# Patient Record
Sex: Male | Born: 1980 | Hispanic: No | Marital: Single | Smoking: Current every day smoker
Health system: Southern US, Community
[De-identification: ages and names within clinical notes are randomized; demographics above are authoritative.]

---

## 2011-06-16 ENCOUNTER — Emergency Department (HOSPITAL_BASED_OUTPATIENT_CLINIC_OR_DEPARTMENT_OTHER)
Admission: EM | Admit: 2011-06-16 | Discharge: 2011-06-17 | Disposition: A | Discharge status: Home / Self Care | Payer: Managed Care, Other (non HMO) | Attending: Emergency Medicine | Admitting: Emergency Medicine

## 2011-06-16 ENCOUNTER — Encounter (HOSPITAL_BASED_OUTPATIENT_CLINIC_OR_DEPARTMENT_OTHER): Payer: Self-pay

## 2011-06-16 DIAGNOSIS — F17200 Nicotine dependence, unspecified, uncomplicated: Secondary | ICD-10-CM | POA: Insufficient documentation

## 2011-06-16 DIAGNOSIS — K529 Noninfective gastroenteritis and colitis, unspecified: Secondary | ICD-10-CM

## 2011-06-16 DIAGNOSIS — K5289 Other specified noninfective gastroenteritis and colitis: Secondary | ICD-10-CM | POA: Insufficient documentation

## 2011-06-16 DIAGNOSIS — R112 Nausea with vomiting, unspecified: Secondary | ICD-10-CM | POA: Insufficient documentation

## 2011-06-16 DIAGNOSIS — R197 Diarrhea, unspecified: Secondary | ICD-10-CM | POA: Insufficient documentation

## 2011-06-16 LAB — COMPREHENSIVE METABOLIC PANEL
Albumin: 4.2 g/dL (ref 3.5–5.2)
BUN: 15 mg/dL (ref 6–23)
CO2: 30 mEq/L (ref 19–32)
Chloride: 101 mEq/L (ref 96–112)
Creatinine, Ser: 0.8 mg/dL (ref 0.50–1.35)
GFR calc Af Amer: >90 mL/min (ref >90)
GFR calc non Af Amer: >90 mL/min (ref >90)
Glucose, Bld: 101 mg/dL — ABNORMAL HIGH (ref 70–99)
Total Bilirubin: 1.1 mg/dL (ref 0.3–1.2)

## 2011-06-16 LAB — CBC
HCT: 42.2 % (ref 39.0–52.0)
Hemoglobin: 14.9 g/dL (ref 13.0–17.0)
MCHC: 35.3 g/dL (ref 30.0–36.0)

## 2011-06-16 LAB — DIFFERENTIAL
Basophils Relative: 0 % (ref 0–1)
Monocytes Absolute: 0.4 10*3/uL (ref 0.1–1.0)
Monocytes Relative: 4 % (ref 3–12)
Neutro Abs: 9.1 10*3/uL — ABNORMAL HIGH (ref 1.7–7.7)

## 2011-06-16 MED ORDER — SODIUM CHLORIDE 0.9 % IV SOLN
Freq: Once | INTRAVENOUS | Status: AC
  Administered 2011-06-16: 23:00:00 via INTRAVENOUS

## 2011-06-16 MED ORDER — KETOROLAC TROMETHAMINE 30 MG/ML IJ SOLN
30.0000 mg | Freq: Once | INTRAMUSCULAR | Status: AC
  Administered 2011-06-16: 30 mg via INTRAVENOUS
  Filled 2011-06-16: qty 1

## 2011-06-16 MED ORDER — ONDANSETRON HCL 4 MG/2ML IJ SOLN
4.0000 mg | Freq: Once | INTRAMUSCULAR | Status: AC
  Administered 2011-06-16: 4 mg via INTRAVENOUS
  Filled 2011-06-16: qty 2

## 2011-06-16 NOTE — ED Provider Notes (Signed)
History     CSN: [id number]  Arrival date & time 06/16/11  2213   First MD Initiated Contact with Patient 06/16/11 2306      Chief Complaint  Patient presents with  . Nausea, vomiting and diarrhea     (Consider location/radiation/quality/duration/timing/severity/associated sxs/prior treatment) Patient is a 31 y.o. male presenting with diarrhea. The history is provided by the patient.  Diarrhea The primary symptoms include nausea, vomiting and diarrhea. The illness began today. The onset was sudden. The problem has been rapidly worsening.  The illness is also significant for chills. Associated medical issues do not include inflammatory bowel disease.    History reviewed. No pertinent past medical history.  History reviewed. No pertinent past surgical history.  No family history on file.  History  Substance Use Topics  . Smoking status: Current Everyday Smoker  . Smokeless tobacco: Not on file  . Alcohol Use: Yes      Review of Systems  Constitutional: Positive for chills.  Gastrointestinal: Positive for nausea, vomiting and diarrhea.  All other systems reviewed and are negative.    Allergies  Review of patient's allergies indicates no known allergies.  Home Medications   Current Outpatient Rx  Name Route Sig Dispense Refill  . CALCIUM CARBONATE ANTACID 500 MG PO CHEW Oral Chew 1 tablet by mouth daily.      BP 126/87  Pulse 95  Temp(Src) 98.7 F (37.1 C) (Oral)  Resp 18  Ht 6' (1.829 m)  Wt 160 lb (72.576 kg)  BMI 21.70 kg/m2  SpO2 99%  Physical Exam  Nursing note and vitals reviewed. Constitutional: He is oriented to person, place, and time. He appears well-developed and well-nourished. No distress.  HENT:  Head: Normocephalic and atraumatic.  Mouth/Throat: Oropharynx is clear and moist.  Neck: Normal range of motion. Neck supple.  Cardiovascular: Normal rate and regular rhythm.   No murmur heard. Pulmonary/Chest: Breath sounds normal. No  respiratory distress. He has no wheezes.  Abdominal: Soft. Bowel sounds are normal. He exhibits no distension. There is no tenderness.  Musculoskeletal: Normal range of motion.  Neurological: He is alert and oriented to person, place, and time.  Skin: Skin is warm and dry. He is not diaphoretic.    ED Course  Procedures (including critical care time)   Labs Reviewed  CBC  DIFFERENTIAL  COMPREHENSIVE METABOLIC PANEL   No results found.   No diagnosis found.    MDM  The labs and presentation reflect acute gastroenteritis.  Will discharge with phenergan, follow up prn.        Geoffery Lyons, MD 06/17/11 0000

## 2011-06-16 NOTE — ED Notes (Signed)
C/o diarrhea x 5 started approx 1230pm-vomiting x 3 started 4pm-denies abd pain

## 2011-06-17 MED ORDER — PROMETHAZINE HCL 25 MG PO TABS: 25.0000 mg | ORAL_TABLET | Freq: Four times a day (QID) | ORAL | Status: AC | PRN

## 2011-06-17 NOTE — Discharge Instructions (Signed)
Viral Gastroenteritis Viral gastroenteritis is also known as stomach flu. This condition affects the stomach and intestinal tract. It can cause sudden diarrhea and vomiting. The illness typically lasts 3 to 8 days. Most people develop an immune response that eventually gets rid of the virus. While this natural response develops, the virus can make you quite ill. CAUSES  Many different viruses can cause gastroenteritis, such as rotavirus or noroviruses. You can catch one of these viruses by consuming contaminated food or water. You may also catch a virus by sharing utensils or other personal items with an infected person or by touching a contaminated surface. SYMPTOMS  The most common symptoms are diarrhea and vomiting. These problems can cause a severe loss of body fluids (dehydration) and a body salt (electrolyte) imbalance. Other symptoms may include:  Fever.   Headache.   Fatigue.   Abdominal pain.  DIAGNOSIS  Your caregiver can usually diagnose viral gastroenteritis based on your symptoms and a physical exam. A stool sample may also be taken to test for the presence of viruses or other infections. TREATMENT  This illness typically goes away on its own. Treatments are aimed at rehydration. The most serious cases of viral gastroenteritis involve vomiting so severely that you are not able to keep fluids down. In these cases, fluids must be given through an intravenous line (IV). HOME CARE INSTRUCTIONS   Drink enough fluids to keep your urine clear or pale yellow. Drink small amounts of fluids frequently and increase the amounts as tolerated.   Ask your caregiver for specific rehydration instructions.   Avoid:   Foods high in sugar.   Alcohol.   Carbonated drinks.   Tobacco.   Juice.   Caffeine drinks.   Extremely hot or cold fluids.   Fatty, greasy foods.   Too much intake of anything at one time.   Dairy products until 24 to 48 hours after diarrhea stops.   You may  consume probiotics. Probiotics are active cultures of beneficial bacteria. They may lessen the amount and number of diarrheal stools in adults. Probiotics can be found in yogurt with active cultures and in supplements.   Wash your hands well to avoid spreading the virus.   Only take over-the-counter or prescription medicines for pain, discomfort, or fever as directed by your caregiver. Do not give aspirin to children. Antidiarrheal medicines are not recommended.   Ask your caregiver if you should continue to take your regular prescribed and over-the-counter medicines.   Keep all follow-up appointments as directed by your caregiver.  SEEK IMMEDIATE MEDICAL CARE IF:   You are unable to keep fluids down.   You do not urinate at least once every 6 to 8 hours.   You develop shortness of breath.   You notice blood in your stool or vomit. This may look like coffee grounds.   You have abdominal pain that increases or is concentrated in one small area (localized).   You have persistent vomiting or diarrhea.   You have a fever.   The patient is a child younger than 3 months, and he or she has a fever.   The patient is a child older than 3 months, and he or she has a fever and persistent symptoms.   The patient is a child older than 3 months, and he or she has a fever and symptoms suddenly get worse.   The patient is a baby, and he or she has no tears when crying.  MAKE SURE YOU:     Understand these instructions.   Will watch your condition.   Will get help right away if you are not doing well or get worse.  Document Released: 01/26/2005 Document Revised: 01/15/2011 Document Reviewed: 11/12/2010 [employer] Patient Information 2012 [employer]. 

## 2011-11-07 ENCOUNTER — Emergency Department (HOSPITAL_BASED_OUTPATIENT_CLINIC_OR_DEPARTMENT_OTHER)
Admission: EM | Admit: 2011-11-07 | Discharge: 2011-11-07 | Disposition: A | Discharge status: Home / Self Care | Payer: Worker's Compensation | Attending: Emergency Medicine | Admitting: Emergency Medicine

## 2011-11-07 ENCOUNTER — Encounter (HOSPITAL_BASED_OUTPATIENT_CLINIC_OR_DEPARTMENT_OTHER): Payer: Self-pay | Admitting: Emergency Medicine

## 2011-11-07 ENCOUNTER — Emergency Department (HOSPITAL_BASED_OUTPATIENT_CLINIC_OR_DEPARTMENT_OTHER): Payer: Worker's Compensation

## 2011-11-07 DIAGNOSIS — R0789 Other chest pain: Secondary | ICD-10-CM

## 2011-11-07 DIAGNOSIS — F17200 Nicotine dependence, unspecified, uncomplicated: Secondary | ICD-10-CM | POA: Insufficient documentation

## 2011-11-07 DIAGNOSIS — R071 Chest pain on breathing: Secondary | ICD-10-CM | POA: Insufficient documentation

## 2011-11-07 MED ORDER — IBUPROFEN 200 MG PO TABS
600.0000 mg | ORAL_TABLET | Freq: Once | ORAL | Status: AC
  Administered 2011-11-07: 600 mg via ORAL
  Filled 2011-11-07: qty 1

## 2011-11-07 NOTE — ED Notes (Signed)
Discharge instructions reviewed. Pt verbalized understanding.  

## 2011-11-07 NOTE — ED Notes (Signed)
Pt states he was at working and he was lifting a box that weighed around 30-40lbs and when he turned to put box down he felt a pop in his left chest area. He states it "hurts to take a deep breath". Rates pain a 4/10 but increases when he grabs for anything.

## 2011-11-07 NOTE — ED Provider Notes (Addendum)
History     CSN: [id number]  Arrival date & time 11/07/11  1032   First MD Initiated Contact with Patient 11/07/11 1048      Chief Complaint  Patient presents with  . Muscle Pain    (Consider location/radiation/quality/duration/timing/severity/associated sxs/prior treatment) Patient is a 31 y.o. male presenting with musculoskeletal pain. The history is provided by the patient.  Muscle Pain This is a new (was lifting a box and felt a pop in the chest wall and severe pain and then moved his arm and felt another pop with impvoement in sx.) problem. The current episode started 1 to 2 hours ago. The problem occurs constantly. The problem has been gradually improving. Associated symptoms include chest pain. Exacerbated by: certain movement of the arms and deep breathing. The symptoms are relieved by rest. He has tried nothing for the symptoms. The treatment provided significant relief.    History reviewed. No pertinent past medical history.  History reviewed. No pertinent past surgical history.  History reviewed. No pertinent family history.  History  Substance Use Topics  . Smoking status: Current Every Day Smoker -- 0.5 packs/day  . Smokeless tobacco: Not on file  . Alcohol Use: Yes      Review of Systems  Cardiovascular: Positive for chest pain.  All other systems reviewed and are negative.    Allergies  Review of patient's allergies indicates no known allergies.  Home Medications   Current Outpatient Rx  Name Route Sig Dispense Refill  . CALCIUM CARBONATE ANTACID 500 MG PO CHEW Oral Chew 1 tablet by mouth daily.    Marland Kitchen PROMETHAZINE HCL 25 MG PO TABS Oral Take 1 tablet (25 mg total) by mouth every 6 (six) hours as needed for nausea. 10 tablet 0    BP 121/73  Pulse 66  Temp 98 F (36.7 C) (Oral)  Resp 20  SpO2 100%  Physical Exam  Nursing note and vitals reviewed. Constitutional: He is oriented to person, place, and time. He appears well-developed and  well-nourished. No distress.  HENT:  Head: Normocephalic and atraumatic.  Mouth/Throat: Oropharynx is clear and moist.  Eyes: Conjunctivae normal and EOM are normal. Pupils are equal, round, and reactive to light.  Neck: Normal range of motion. Neck supple.  Cardiovascular: Normal rate, regular rhythm and intact distal pulses.   No murmur heard. Pulmonary/Chest: Effort normal and breath sounds normal. No respiratory distress. He has no wheezes. He has no rales. He exhibits tenderness.    Abdominal: Soft. He exhibits no distension. There is no tenderness. There is no rebound and no guarding.  Musculoskeletal: Normal range of motion. He exhibits no edema and no tenderness.  Neurological: He is alert and oriented to person, place, and time.  Skin: Skin is warm and dry. No rash noted. No erythema.  Psychiatric: He has a normal mood and affect. His behavior is normal.    ED Course  Procedures (including critical care time)  Labs Reviewed - No data to display Dg Chest 2 View  11/07/2011  *RADIOLOGY REPORT*  Clinical Data: Left-sided chest wall pain.  CHEST - 2 VIEW  Comparison: No priors.  Findings: Lung volumes are normal.  No consolidative airspace disease.  No pleural effusions.  No pneumothorax.  No pulmonary nodule or mass noted.  Pulmonary vasculature and the cardiomediastinal silhouette are within normal limits.  IMPRESSION: 1. No radiographic evidence of acute cardiopulmonary disease.   Original Report Authenticated By: Florencia Reasons, M.D.      1. Chest wall  pain       MDM   Patient with chest wall pain on the left side after he was lifting a 30-40 pound crate today. He states that he moved his arm and he felt that there would pop back. Patient has no deficits now and only mild chest wall tenderness. He has no shortness of breath and normal breath sounds. Chest x-ray pending however feel most likely subluxation. Patient was given ibuprofen for his pain.   12:20 PM' Plain  film neg will d/c home.     Gwyneth Sprout, MD 11/07/11 1221  Gwyneth Sprout, MD 11/07/11 1229

## 2013-09-29 IMAGING — CR DG CHEST 2V
2 series · 2 of 2 positions shown · non-contrast
Comparison: No priors.

CLINICAL DATA: Left-sided chest wall pain.

CHEST - 2 VIEW

[w chest pa]
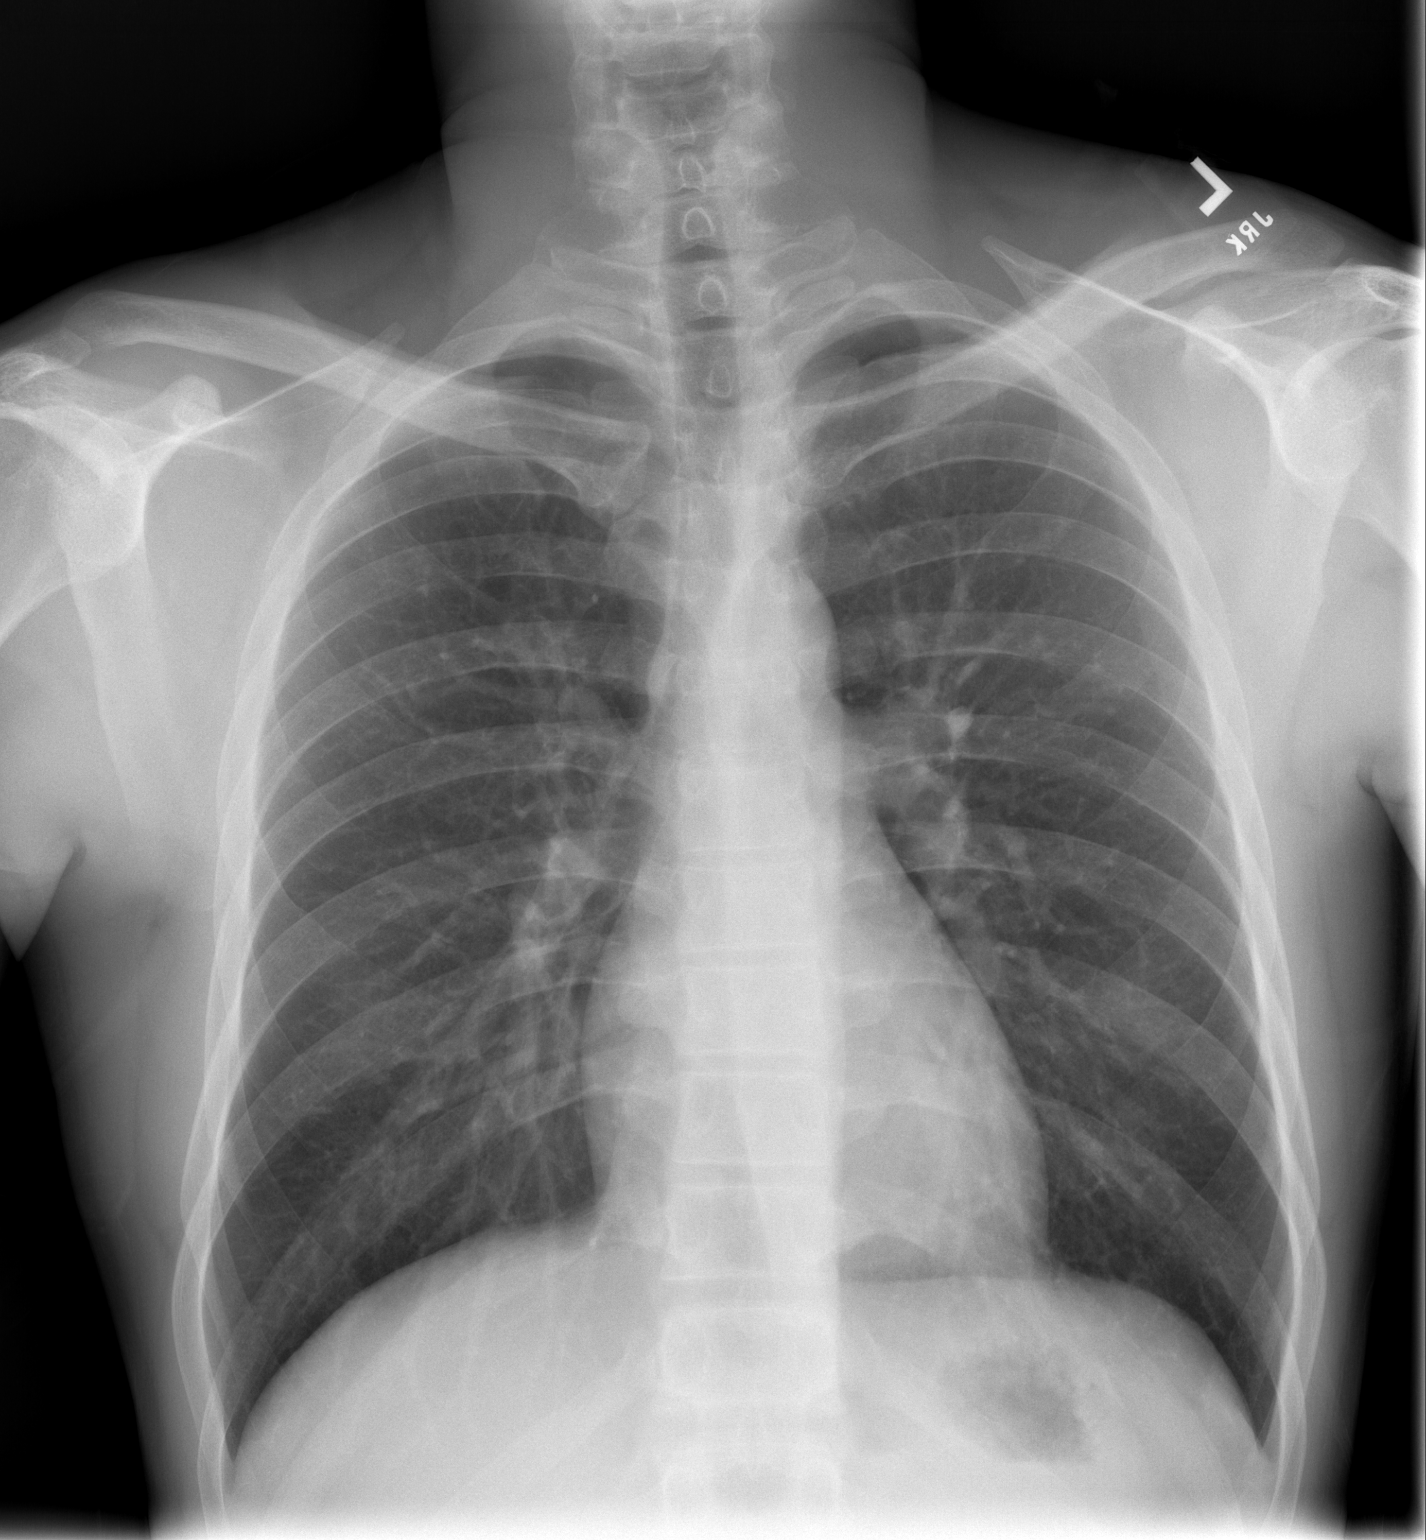

[w chest lat]
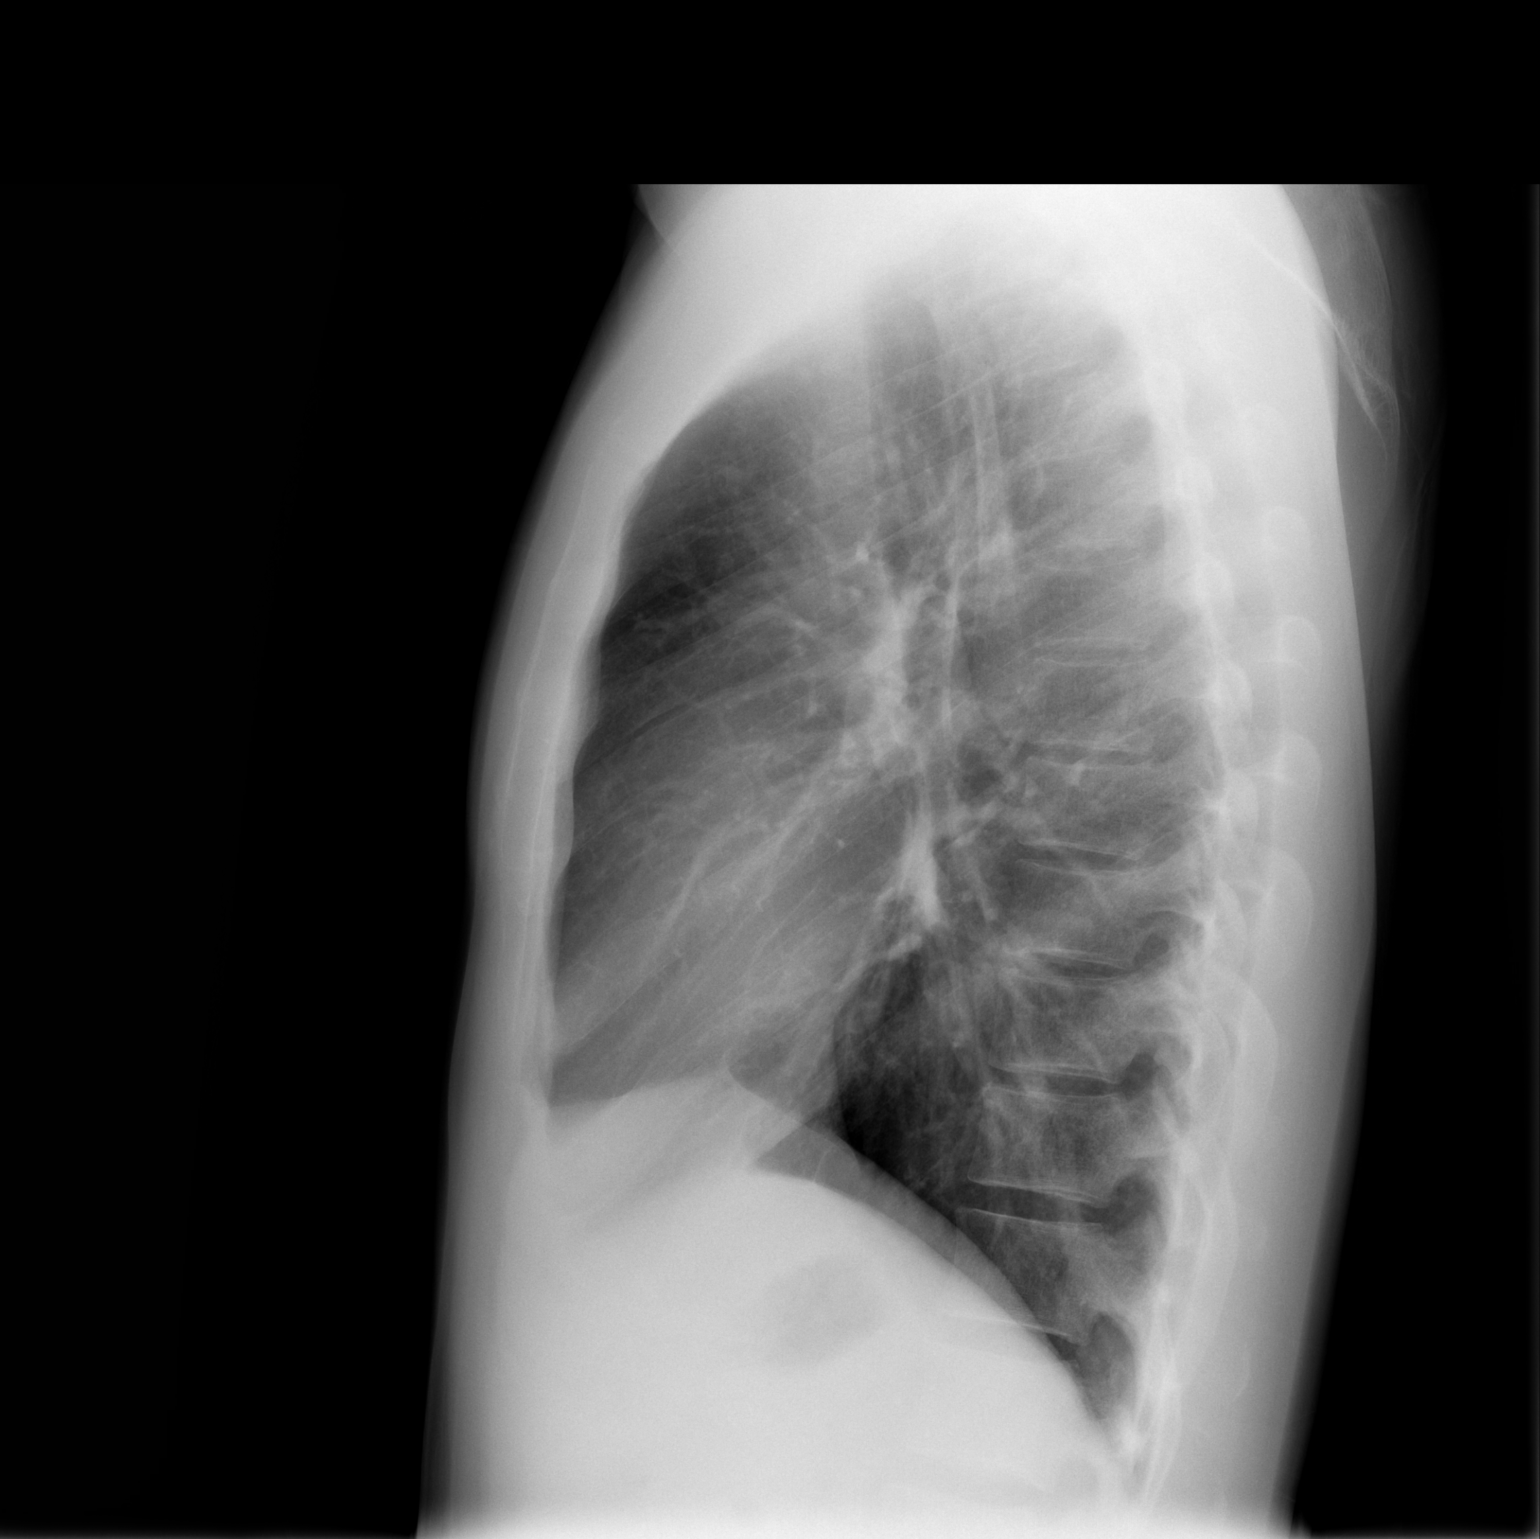

[2 of 2 positions shown; findings below may reference images not displayed]

FINDINGS: Lung volumes are normal.  No consolidative airspace
disease.  No pleural effusions.  No pneumothorax.  No pulmonary
nodule or mass noted.  Pulmonary vasculature and the
cardiomediastinal silhouette are within normal limits.
IMPRESSION: 1. No radiographic evidence of acute cardiopulmonary disease.

## 2016-06-08 ENCOUNTER — Ambulatory Visit (HOSPITAL_BASED_OUTPATIENT_CLINIC_OR_DEPARTMENT_OTHER)
Admission: RE | Admit: 2016-06-08 | Discharge: 2016-06-08 | Disposition: A | Discharge status: Home / Self Care | Payer: Managed Care, Other (non HMO) | Source: Ambulatory Visit | Attending: Orthopaedic Surgery | Admitting: Orthopaedic Surgery

## 2016-06-08 ENCOUNTER — Ambulatory Visit (INDEPENDENT_AMBULATORY_CARE_PROVIDER_SITE_OTHER): Payer: Managed Care, Other (non HMO) | Admitting: Orthopaedic Surgery

## 2016-06-08 ENCOUNTER — Ambulatory Visit (INDEPENDENT_AMBULATORY_CARE_PROVIDER_SITE_OTHER): Payer: Self-pay | Admitting: Orthopaedic Surgery

## 2016-06-08 DIAGNOSIS — M25462 Effusion, left knee: Secondary | ICD-10-CM | POA: Insufficient documentation

## 2016-06-08 DIAGNOSIS — M25562 Pain in left knee: Secondary | ICD-10-CM

## 2016-06-08 MED ORDER — METHYLPREDNISOLONE ACETATE 40 MG/ML IJ SUSP
40.0000 mg | INTRAMUSCULAR | Status: AC | PRN
  Administered 2016-06-08: 40 mg via INTRA_ARTICULAR

## 2016-06-08 MED ORDER — LIDOCAINE HCL 1 % IJ SOLN
3.0000 mL | INTRAMUSCULAR | Status: AC | PRN
  Administered 2016-06-08: 3 mL

## 2016-06-08 NOTE — Progress Notes (Signed)
   Office Visit Note   Patient: Kenneth Wilkins           Date of Birth: December 08, 1980           MRN: [id number] Visit Date: 06/08/2016              Requested by: No referring provider defined for this encounter. PCP: No PCP Per Patient   Assessment & Plan: Visit Diagnoses:  1. Left knee pain, unspecified chronicity     Plan: I spoke with him about trying an aspiration of his knee with placing a steroid injection in the knee as well and trying hinged knee brace. He agreed with all these. Questions were encouraged and answered. I like to reevaluate him in 2 weeks to determine if any other intervention is needed. He'll withhold himself from high impact aerobic activities and contact sports during that period of time.  Follow-Up Instructions: Return in about 2 weeks (around 06/22/2016).   Orders:  Orders Placed This Encounter  Procedures  . Large Joint Injection/Arthrocentesis  . DG Knee 1-2 Views Left   No orders of the defined types were placed in this encounter.     Procedures: Large Joint Inj Date/Time: 06/08/2016 10:22 AM Performed by: Kathryne Hitch Authorized by: Kathryne Hitch   Location:  Knee Site:  L knee Ultrasound Guidance: No   Fluoroscopic Guidance: No   Arthrogram: No   Medications:  3 mL lidocaine 1 %; 40 mg methylPREDNISolone acetate 40 MG/ML     Clinical Data: No additional findings.   Subjective: No chief complaint on file.   HPI  Review of Systems   Objective: Vital Signs: There were no vitals taken for this visit.  Physical Exam He is alert and oriented 3 and in no acute distress. He does walk with a slight limp. Ortho Exam Examination of his left knee shows a mild effusion. His ligamentous exam though feels stable. He's got good range of motion but is painful. Specialty Comments:  No specialty comments available.  Imaging: Dg Knee 1-2 Views Left  Result Date: 06/08/2016 CLINICAL DATA:  Left knee pain, no known  injury, initial encounter EXAM: LEFT KNEE - 1-2 VIEW COMPARISON:  None. FINDINGS: There are changes consistent with prior ACL repair. Mild medial joint space narrowing is noted. No acute fracture or dislocation is seen. Small joint effusion is noted. IMPRESSION: Small joint effusion. Prior surgical repair. Electronically Signed   By: Alcide Clever M.D.   On: 06/08/2016 10:15   I) Nedra Hai reviewed x-rays of his left knee and other than a slight effusion to see these had anterior cruciate ligament reconstruction before in terms of the pullout hardware. The joint space is still well maintained. There is no evidence of acute injury such as a fracture.  PMFS History: There are no active problems to display for this patient.  No past medical history on file.  No family history on file.  No past surgical history on file. Social History   Occupational History  . Not on file.   Social History Main Topics  . Smoking status: Current Every Day Smoker    Packs/day: 0.50  . Smokeless tobacco: Not on file  . Alcohol use Yes  . Drug use: No  . Sexual activity: Not on file

## 2016-06-22 ENCOUNTER — Ambulatory Visit (INDEPENDENT_AMBULATORY_CARE_PROVIDER_SITE_OTHER): Payer: Managed Care, Other (non HMO) | Admitting: Orthopaedic Surgery

## 2016-06-29 ENCOUNTER — Ambulatory Visit (INDEPENDENT_AMBULATORY_CARE_PROVIDER_SITE_OTHER): Payer: Managed Care, Other (non HMO) | Admitting: Orthopaedic Surgery

## 2018-05-01 IMAGING — DX DG KNEE 1-2V*L*
2 series · 2 of 2 positions shown · non-contrast
Comparison: None.

CLINICAL DATA: Left knee pain, no known injury, initial encounter

EXAM:
LEFT KNEE - 1-2 VIEW

[knee ap]
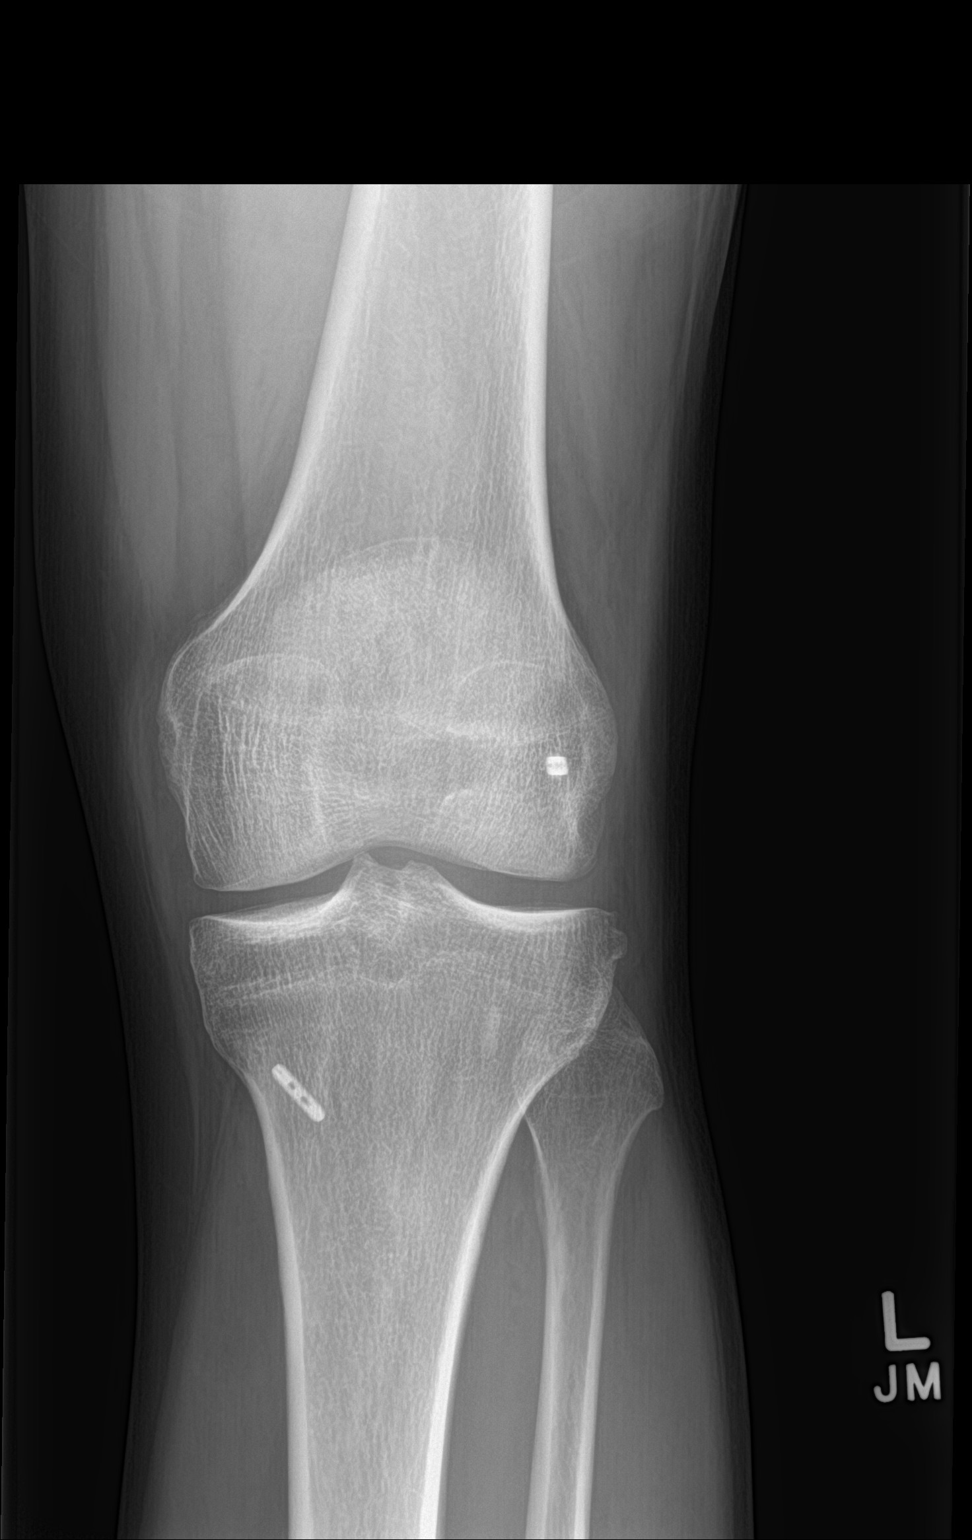

[knee lat]
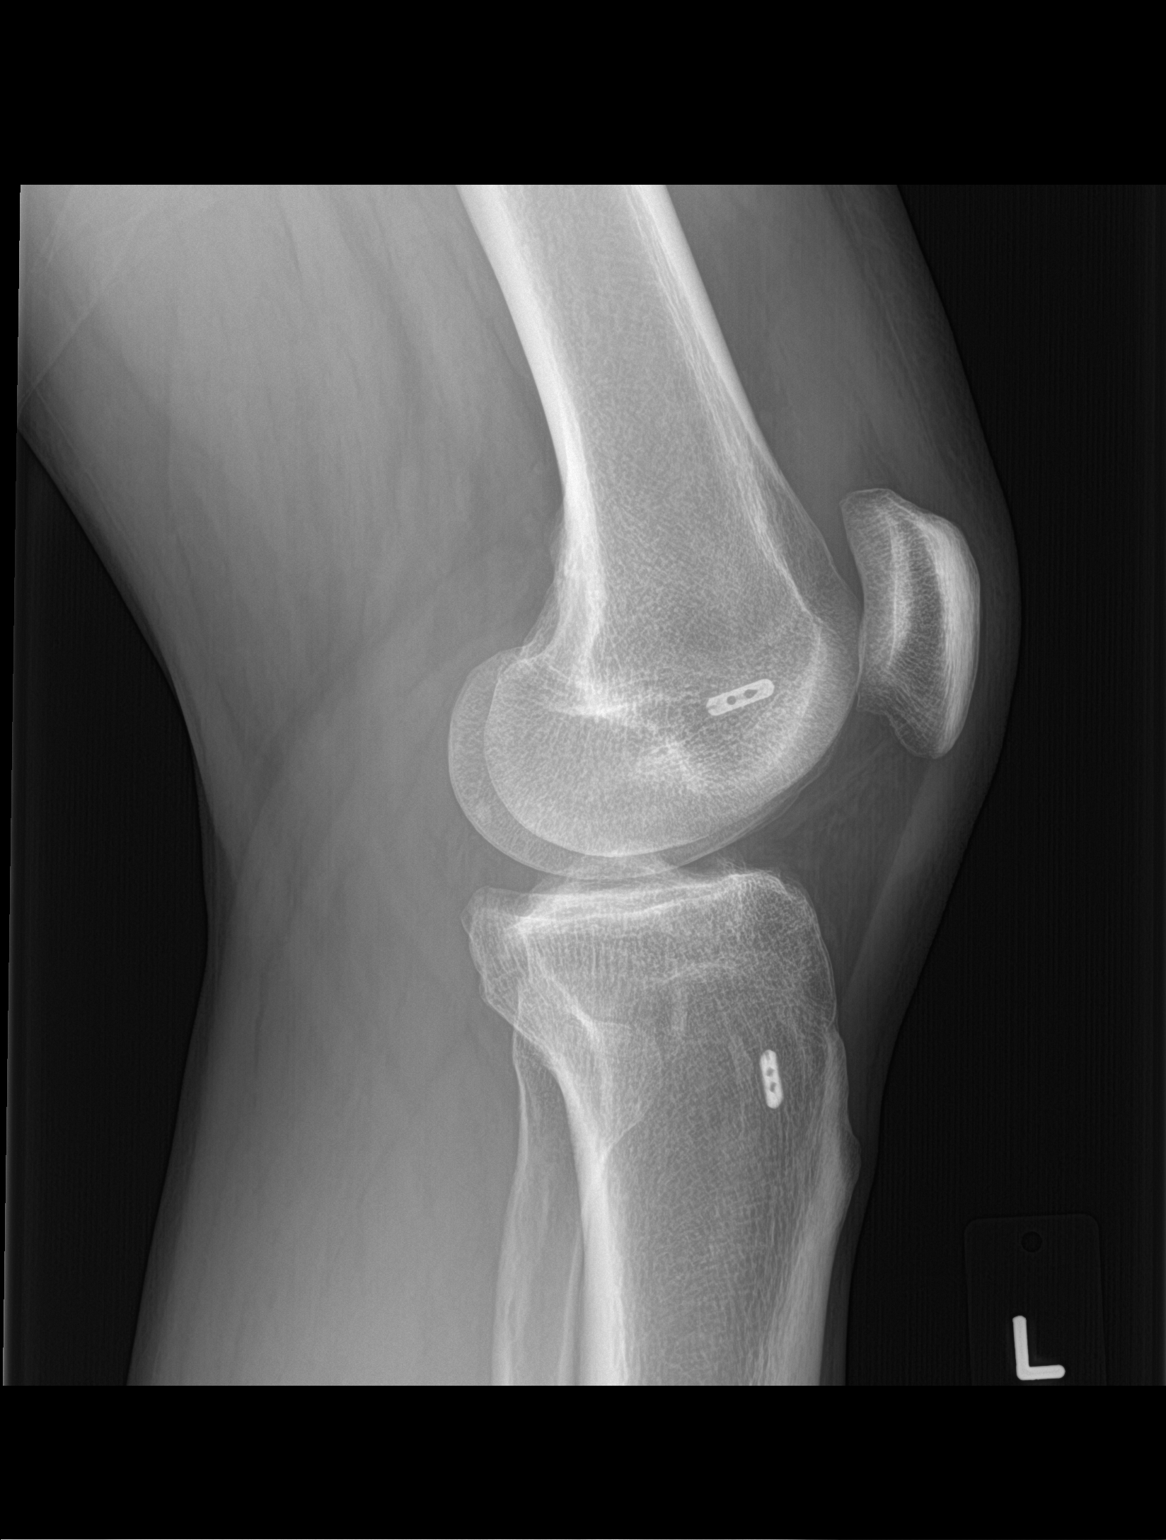

[2 of 2 positions shown; findings below may reference images not displayed]

FINDINGS: There are changes consistent with prior ACL repair. Mild medial
joint space narrowing is noted. No acute fracture or dislocation is
seen. Small joint effusion is noted.
IMPRESSION: Small joint effusion.

Prior surgical repair.
